# Patient Record
Sex: Female | Born: 1965 | Race: Black or African American | Hispanic: No | Marital: Single
Health system: Southern US, Community
[De-identification: ages and names within clinical notes are randomized; demographics above are authoritative.]

---

## 1998-11-13 ENCOUNTER — Emergency Department (HOSPITAL_COMMUNITY): Admission: EM | Admit: 1998-11-13 | Discharge: 1998-11-13 | Payer: Self-pay | Admitting: Emergency Medicine

## 1998-11-13 ENCOUNTER — Encounter: Payer: Self-pay | Admitting: Emergency Medicine

## 2000-11-07 ENCOUNTER — Encounter: Admission: RE | Admit: 2000-11-07 | Discharge: 2000-11-07 | Payer: Self-pay | Admitting: Family Medicine

## 2000-11-07 ENCOUNTER — Encounter: Payer: Self-pay | Admitting: Family Medicine

## 2002-01-23 ENCOUNTER — Other Ambulatory Visit: Admission: RE | Admit: 2002-01-23 | Discharge: 2002-01-23 | Payer: Self-pay | Admitting: Obstetrics and Gynecology

## 2003-08-03 ENCOUNTER — Other Ambulatory Visit: Admission: RE | Admit: 2003-08-03 | Discharge: 2003-08-03 | Payer: Self-pay | Admitting: Obstetrics and Gynecology

## 2006-03-14 ENCOUNTER — Inpatient Hospital Stay (HOSPITAL_COMMUNITY): Admission: AD | Admit: 2006-03-14 | Discharge: 2006-03-14 | Payer: Self-pay | Admitting: Obstetrics and Gynecology

## 2009-08-06 ENCOUNTER — Ambulatory Visit (HOSPITAL_COMMUNITY): Admission: RE | Admit: 2009-08-06 | Discharge: 2009-08-06 | Payer: Self-pay | Admitting: Obstetrics and Gynecology

## 2013-01-24 ENCOUNTER — Other Ambulatory Visit: Payer: Self-pay | Admitting: Obstetrics and Gynecology

## 2013-01-24 DIAGNOSIS — R928 Other abnormal and inconclusive findings on diagnostic imaging of breast: Secondary | ICD-10-CM

## 2013-02-04 ENCOUNTER — Ambulatory Visit
Admission: RE | Admit: 2013-02-04 | Discharge: 2013-02-04 | Disposition: A | Discharge status: Home / Self Care | Payer: BC Managed Care – PPO | Source: Ambulatory Visit | Attending: Obstetrics and Gynecology | Admitting: Obstetrics and Gynecology

## 2013-02-04 DIAGNOSIS — R928 Other abnormal and inconclusive findings on diagnostic imaging of breast: Secondary | ICD-10-CM

## 2014-01-29 ENCOUNTER — Other Ambulatory Visit: Payer: Self-pay | Admitting: Obstetrics and Gynecology

## 2014-01-29 DIAGNOSIS — R921 Mammographic calcification found on diagnostic imaging of breast: Secondary | ICD-10-CM

## 2014-02-06 ENCOUNTER — Inpatient Hospital Stay: Admission: RE | Admit: 2014-02-06 | Payer: Self-pay | Source: Ambulatory Visit

## 2014-04-07 ENCOUNTER — Other Ambulatory Visit: Payer: Self-pay | Admitting: *Deleted

## 2014-04-07 DIAGNOSIS — R202 Paresthesia of skin: Secondary | ICD-10-CM

## 2014-04-16 ENCOUNTER — Ambulatory Visit (INDEPENDENT_AMBULATORY_CARE_PROVIDER_SITE_OTHER): Payer: BLUE CROSS/BLUE SHIELD | Admitting: Neurology

## 2014-04-16 DIAGNOSIS — R202 Paresthesia of skin: Secondary | ICD-10-CM

## 2014-04-16 DIAGNOSIS — G5601 Carpal tunnel syndrome, right upper limb: Secondary | ICD-10-CM

## 2014-04-16 DIAGNOSIS — G5621 Lesion of ulnar nerve, right upper limb: Secondary | ICD-10-CM

## 2014-04-16 NOTE — Procedures (Signed)
[facility]  [address], [address]  [place], [place] [address] Tel: [phone] Fax:  [phone] Test Date:  04/16/2014  Patient: Kara Krueger DOB: 02-Nov-1965 Physician: Nita Sickleonika Patel, DO  Sex: Female Height: 5\' 7"  Ref Phys: Laurann MontanaWhite, Cynthia  ID#: [id number] Temp: 36.2C Technician:    Patient Complaints: This is a 49 year-old female presenting for evaluation of right arm pain and paresthesias.  NCV & EMG Findings: Extensive electrodiagnostic testing of the right upper extremity shows:  1. The right median sensory nerve showed prolonged distal peak latency (4.4 ms).  Right ulnar sensory response is within normal limits. 2. Evaluation of the right median motor nerve showed prolonged distal onset latency (4.4 ms).  The right ulnar motor nerve showed decreased conduction velocity (A Elbow-B Elbow, 48 m/s). 3. There is no evidence of active or chronic motor axon loss changes affecting any of the tested muscles. Motor unit configuration and recruitment pattern is within normal limits.  Impression: 1. Right median neuropathy at or distal to the wrist, consistent with the clinical diagnosis of carpal tunnel syndrome. Overall, these findings are moderate in degree electrically. 2. Right ulnar neuropathy with slowing across the elbow, purely demyelinating in type. 3. There is no evidence of a cervical radiculopathy affecting the right upper extremity.   ___________________________ Nita Sickleonika Patel, DO    Nerve Conduction Studies Anti Sensory Summary Table   Site NR Peak (ms) Norm Peak (ms) P-T Amp (V) Norm P-T Amp  Right Median Anti Sensory (2nd Digit)  Wrist    4.4 <3.4 21.2 >20  Right Ulnar Anti Sensory (5th Digit)  Wrist    2.7 <3.1 28.1 >12   Motor Summary Table   Site NR Onset (ms) Norm Onset (ms) O-P Amp (mV) Norm O-P Amp Site1 Site2 Delta-0 (ms) Dist (cm) Vel (m/s) Norm Vel (m/s)  Right Median Motor (Abd Poll Brev)  Wrist    4.4 <3.9 10.0 >6 Elbow Wrist 4.7 28.0 60  >50  Elbow    9.1  9.2         Right Ulnar Motor (Abd Dig Minimi)  Wrist    2.4 <3.1 8.4 >7 B Elbow Wrist 3.1 22.0 71 >50  B Elbow    5.5  8.0  A Elbow B Elbow 2.1 10.0 48 >50  A Elbow    7.6  8.0          EMG   Side Muscle Ins Act Fibs Psw Fasc Number Recrt Dur Dur. Amp Amp. Poly Poly. Comment  Right 1stDorInt Nml Nml Nml Nml Nml Nml Nml Nml Nml Nml Nml Nml N/A  Right Abd Poll Brev Nml Nml Nml Nml Nml Nml Nml Nml Nml Nml Nml Nml N/A  Right Ext Indicis Nml Nml Nml Nml Nml Nml Nml Nml Nml Nml Nml Nml N/A  Right PronatorTeres Nml Nml Nml Nml Nml Nml Nml Nml Nml Nml Nml Nml N/A  Right Biceps Nml Nml Nml Nml Nml Nml Nml Nml Nml Nml Nml Nml N/A  Right Triceps Nml Nml Nml Nml Nml Nml Nml Nml Nml Nml Nml Nml N/A  Right Deltoid Nml Nml Nml Nml Nml Nml Nml Nml Nml Nml Nml Nml N/A  Right FlexDigProf 4,5 Nml Nml Nml Nml Nml Nml Nml Nml Nml Nml Nml Nml N/A      Waveforms:

## 2015-10-04 ENCOUNTER — Other Ambulatory Visit: Payer: Self-pay | Admitting: Obstetrics and Gynecology

## 2015-10-04 DIAGNOSIS — R921 Mammographic calcification found on diagnostic imaging of breast: Secondary | ICD-10-CM

## 2015-10-12 ENCOUNTER — Ambulatory Visit
Admission: RE | Admit: 2015-10-12 | Discharge: 2015-10-12 | Disposition: A | Discharge status: Home / Self Care | Payer: BLUE CROSS/BLUE SHIELD | Source: Ambulatory Visit | Attending: Obstetrics and Gynecology | Admitting: Obstetrics and Gynecology

## 2015-10-12 DIAGNOSIS — R921 Mammographic calcification found on diagnostic imaging of breast: Secondary | ICD-10-CM

## 2016-12-03 IMAGING — MG 2D DIGITAL DIAGNOSTIC BILATERAL MAMMOGRAM WITH CAD AND ADJUNCT T
8 of 14 series · 8 of 30 positions shown · non-contrast
Comparison: Previous exams including diagnostic mammogram dated
02/04/2013.

CLINICAL DATA: Probably benign calcifications described in the
right breast on diagnostic mammogram of 02/04/2013. A six-month
follow-up was recommended at that time.Patient returns today for
that follow-up exam

EXAM:
2D DIGITAL DIAGNOSTIC BILATERAL MAMMOGRAM WITH CAD AND ADJUNCT TOMO

[R CC]
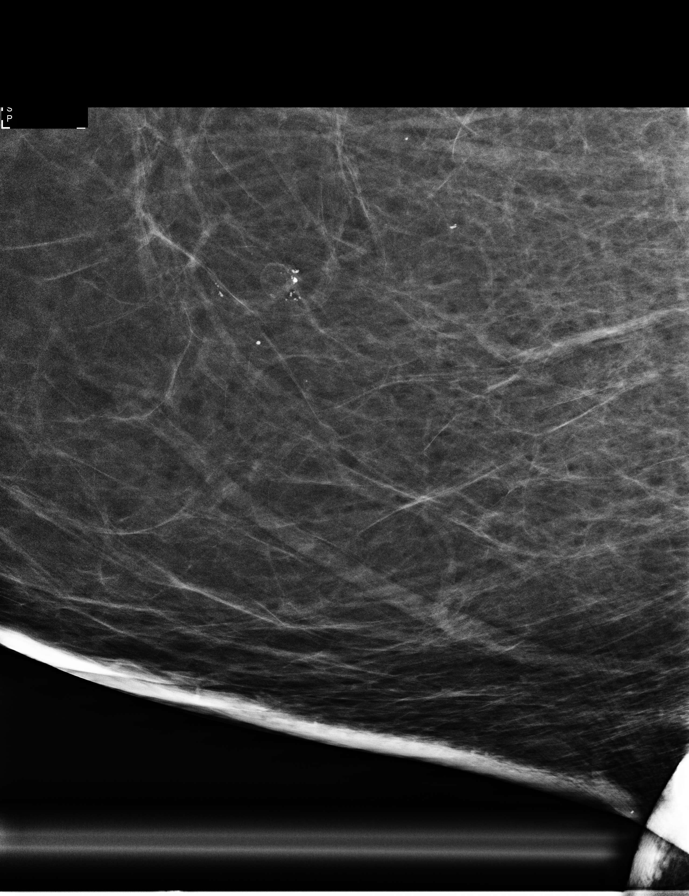

[R ML]
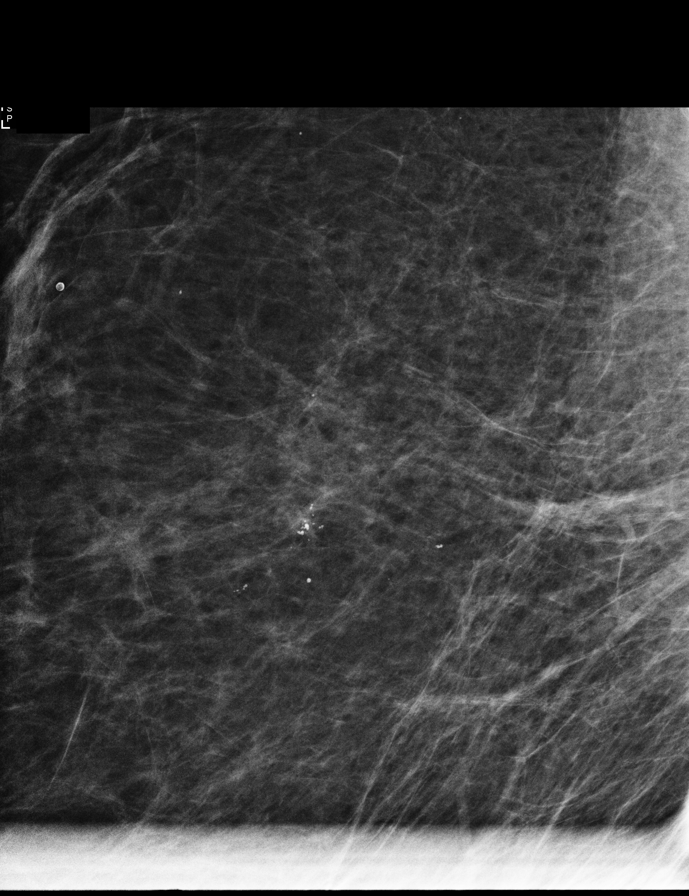

[R CC synth-2D]
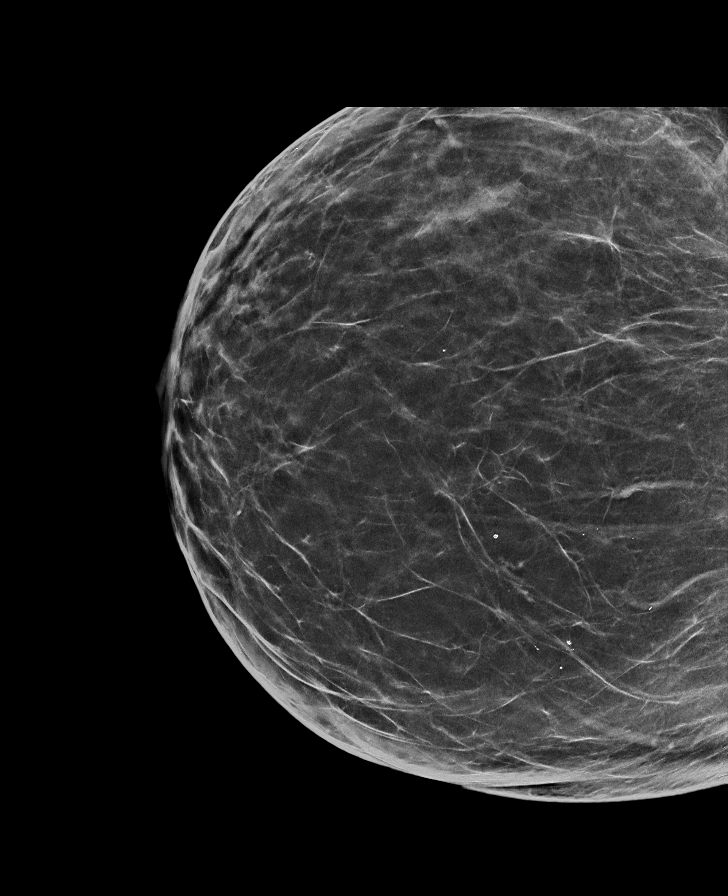

[R MLO]
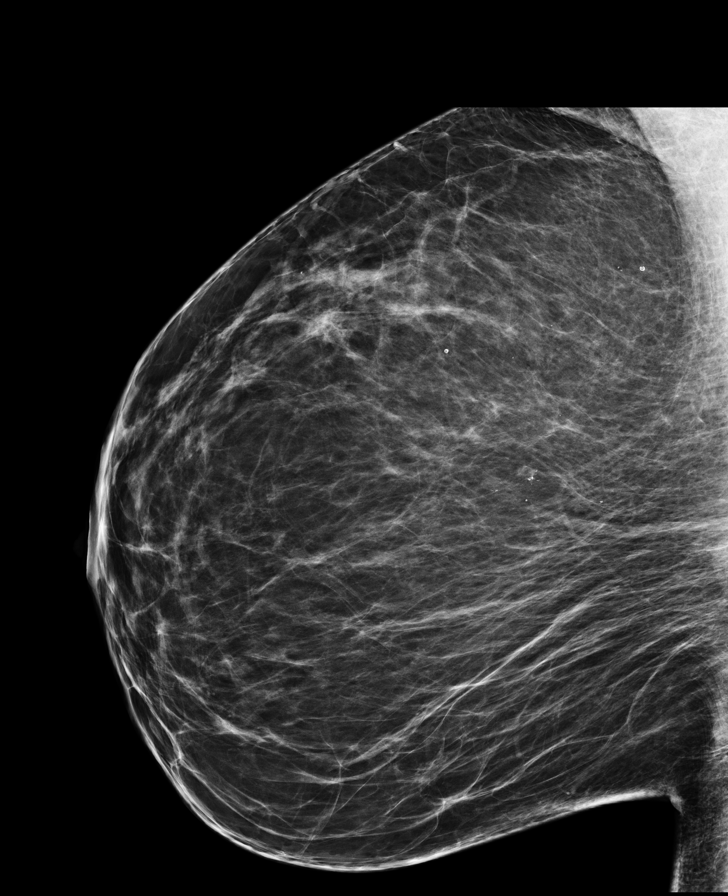

[L CC]
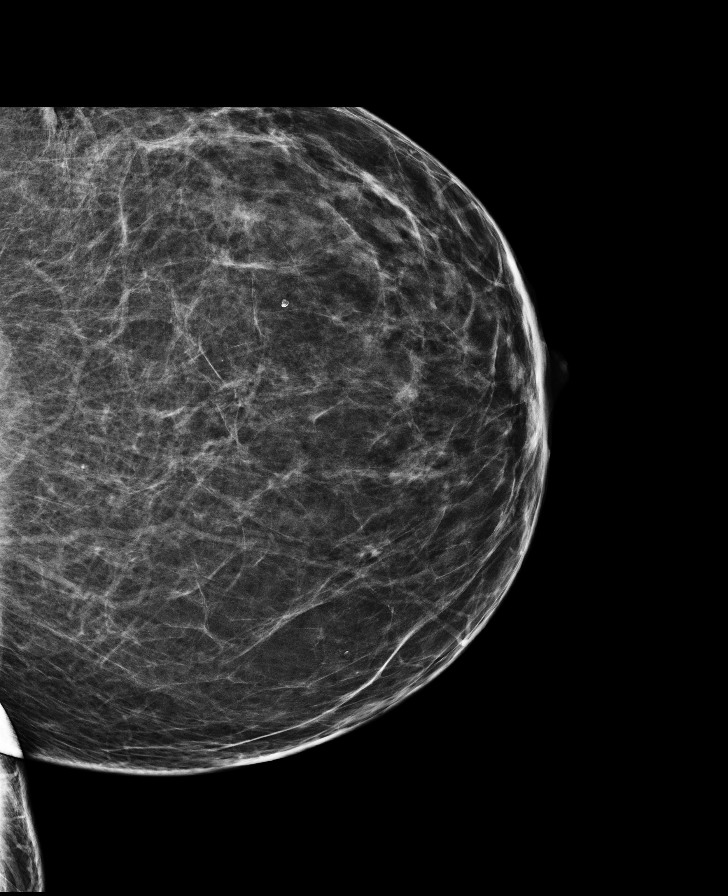

[R MLO synth-2D]
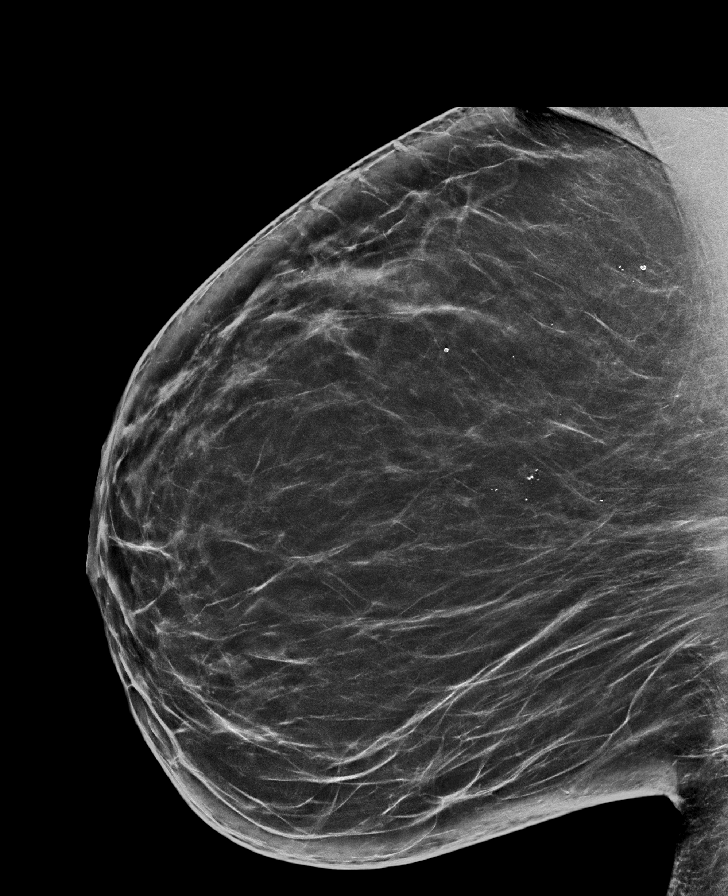

[L MLO]
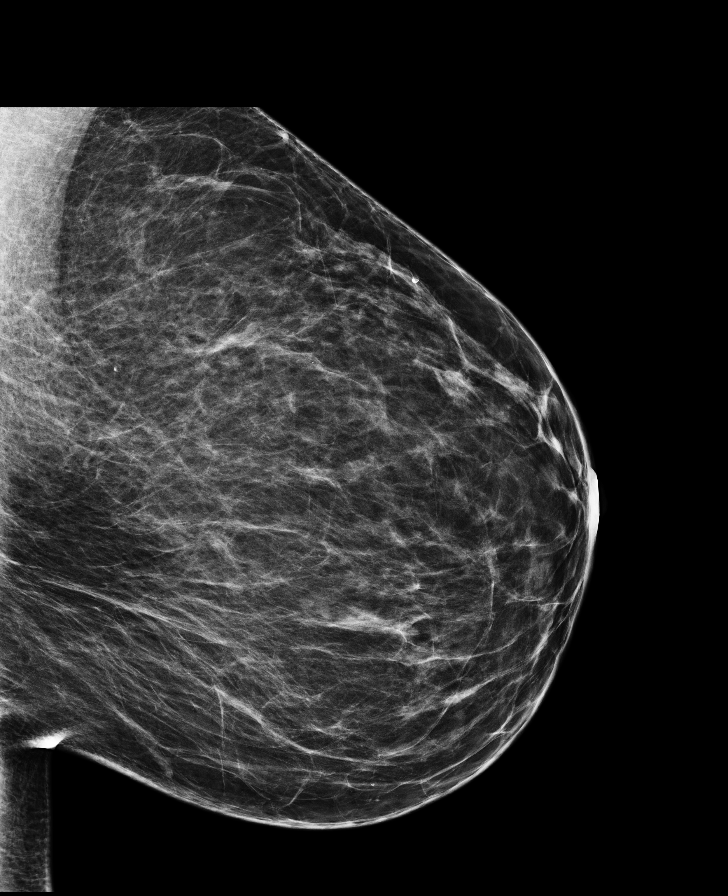

[L MLO synth-2D]
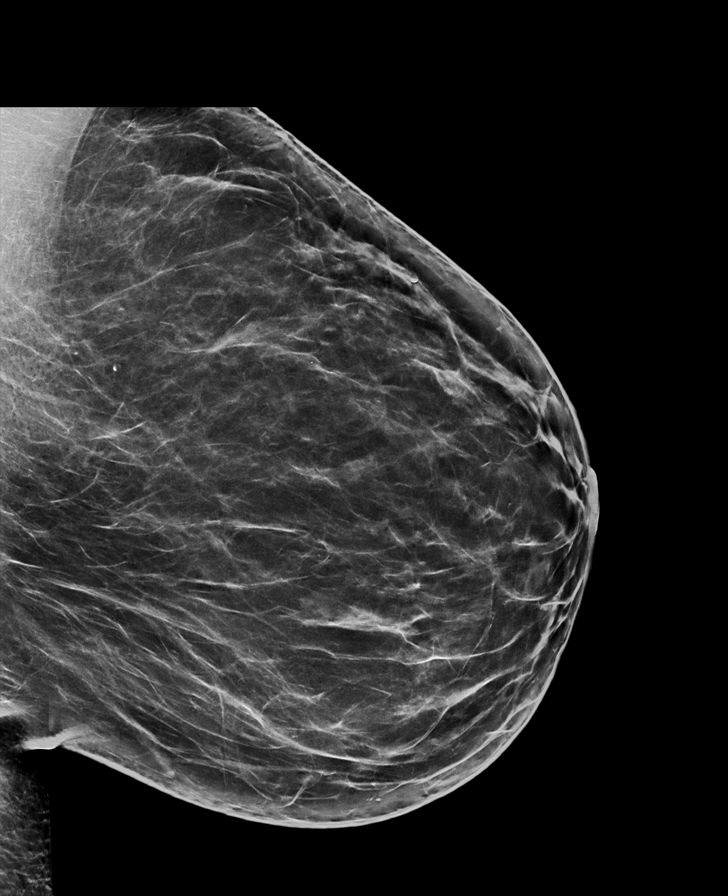

[8 of 30 positions shown; findings below may reference images not displayed]

ACR Breast Density Category b: There are scattered areas of
fibroglandular density.
FINDINGS: The calcifications within the right breast have increased in
coarseness, otherwise stable, indicating benignity. There are no new
dominant masses, suspicious calcifications or secondary signs of
malignancy within either breast.

Mammographic images were processed with CAD.
IMPRESSION: No evidence of malignancy within either breast. Benign
calcifications within the right breast.

Patient may return to routine annual bilateral screening mammogram
schedule.

RECOMMENDATION:
Screening mammogram in one year.(Code:K5-O-5JU)

I have discussed the findings and recommendations with the patient.
Results were also provided in writing at the conclusion of the
visit. If applicable, a reminder letter will be sent to the patient
regarding the next appointment.

BI-RADS CATEGORY  2: Benign.

## 2019-01-16 ENCOUNTER — Other Ambulatory Visit: Payer: BLUE CROSS/BLUE SHIELD

## 2019-01-16 ENCOUNTER — Ambulatory Visit: Payer: PRIVATE HEALTH INSURANCE | Attending: Internal Medicine

## 2019-01-16 DIAGNOSIS — Z20822 Contact with and (suspected) exposure to covid-19: Secondary | ICD-10-CM

## 2019-01-18 LAB — NOVEL CORONAVIRUS, NAA: SARS-CoV-2, NAA: NOT DETECTED
